# Patient Record
Sex: Male | Born: 1945 | Race: White | Hispanic: No | Marital: Married | Smoking: Never smoker
Health system: Southern US, Community
[De-identification: ages and names within clinical notes are randomized; demographics above are authoritative.]

## PROBLEM LIST (undated history)

## (undated) DIAGNOSIS — F32A Depression, unspecified: Secondary | ICD-10-CM

## (undated) DIAGNOSIS — I219 Acute myocardial infarction, unspecified: Secondary | ICD-10-CM

## (undated) DIAGNOSIS — Z87442 Personal history of urinary calculi: Secondary | ICD-10-CM

## (undated) DIAGNOSIS — M1990 Unspecified osteoarthritis, unspecified site: Secondary | ICD-10-CM

## (undated) DIAGNOSIS — F988 Other specified behavioral and emotional disorders with onset usually occurring in childhood and adolescence: Secondary | ICD-10-CM

## (undated) DIAGNOSIS — F419 Anxiety disorder, unspecified: Secondary | ICD-10-CM

## (undated) DIAGNOSIS — G4730 Sleep apnea, unspecified: Secondary | ICD-10-CM

## (undated) DIAGNOSIS — R51 Headache: Secondary | ICD-10-CM

## (undated) DIAGNOSIS — F329 Major depressive disorder, single episode, unspecified: Secondary | ICD-10-CM

## (undated) HISTORY — PX: HAND SURGERY: SHX662

## (undated) HISTORY — PX: KIDNEY STONE SURGERY: SHX686

---

## 2008-01-10 ENCOUNTER — Ambulatory Visit: Payer: Self-pay | Admitting: Cardiology

## 2008-01-11 ENCOUNTER — Ambulatory Visit: Payer: Self-pay | Admitting: Cardiology

## 2008-01-18 ENCOUNTER — Ambulatory Visit: Payer: Self-pay | Admitting: Cardiology

## 2012-11-26 ENCOUNTER — Ambulatory Visit: Payer: Medicare Other | Attending: Orthopaedic Surgery | Admitting: Physical Therapy

## 2012-11-26 DIAGNOSIS — IMO0001 Reserved for inherently not codable concepts without codable children: Secondary | ICD-10-CM | POA: Insufficient documentation

## 2012-11-26 DIAGNOSIS — M25569 Pain in unspecified knee: Secondary | ICD-10-CM | POA: Insufficient documentation

## 2012-11-26 DIAGNOSIS — R5381 Other malaise: Secondary | ICD-10-CM | POA: Insufficient documentation

## 2012-11-27 ENCOUNTER — Ambulatory Visit: Payer: Medicare Other | Admitting: Physical Therapy

## 2012-12-03 ENCOUNTER — Ambulatory Visit: Payer: Medicare Other | Attending: Orthopaedic Surgery | Admitting: Physical Therapy

## 2012-12-03 DIAGNOSIS — IMO0001 Reserved for inherently not codable concepts without codable children: Secondary | ICD-10-CM | POA: Insufficient documentation

## 2012-12-03 DIAGNOSIS — M79609 Pain in unspecified limb: Secondary | ICD-10-CM | POA: Insufficient documentation

## 2012-12-03 DIAGNOSIS — M25569 Pain in unspecified knee: Secondary | ICD-10-CM | POA: Insufficient documentation

## 2012-12-03 DIAGNOSIS — R5381 Other malaise: Secondary | ICD-10-CM | POA: Insufficient documentation

## 2012-12-05 ENCOUNTER — Ambulatory Visit: Payer: Medicare Other | Admitting: Physical Therapy

## 2013-01-24 ENCOUNTER — Other Ambulatory Visit (HOSPITAL_COMMUNITY): Payer: Self-pay | Admitting: Orthopaedic Surgery

## 2013-01-25 ENCOUNTER — Encounter (HOSPITAL_COMMUNITY): Payer: Self-pay | Admitting: Pharmacy Technician

## 2013-01-30 ENCOUNTER — Encounter (HOSPITAL_COMMUNITY)
Admission: RE | Admit: 2013-01-30 | Discharge: 2013-01-30 | Disposition: A | Discharge status: Home / Self Care | Payer: Medicare Other | Source: Ambulatory Visit | Attending: Orthopaedic Surgery | Admitting: Orthopaedic Surgery

## 2013-01-30 ENCOUNTER — Ambulatory Visit (HOSPITAL_COMMUNITY)
Admission: RE | Admit: 2013-01-30 | Discharge: 2013-01-30 | Disposition: A | Discharge status: Home / Self Care | Payer: Medicare Other | Source: Ambulatory Visit | Attending: Orthopaedic Surgery | Admitting: Orthopaedic Surgery

## 2013-01-30 ENCOUNTER — Encounter (HOSPITAL_COMMUNITY): Payer: Self-pay

## 2013-01-30 DIAGNOSIS — M48061 Spinal stenosis, lumbar region without neurogenic claudication: Secondary | ICD-10-CM | POA: Insufficient documentation

## 2013-01-30 DIAGNOSIS — R51 Headache: Secondary | ICD-10-CM | POA: Insufficient documentation

## 2013-01-30 DIAGNOSIS — Z01810 Encounter for preprocedural cardiovascular examination: Secondary | ICD-10-CM | POA: Insufficient documentation

## 2013-01-30 DIAGNOSIS — Z01818 Encounter for other preprocedural examination: Secondary | ICD-10-CM | POA: Insufficient documentation

## 2013-01-30 HISTORY — DX: Unspecified osteoarthritis, unspecified site: M19.90

## 2013-01-30 HISTORY — DX: Headache: R51

## 2013-01-30 HISTORY — DX: Major depressive disorder, single episode, unspecified: F32.9

## 2013-01-30 HISTORY — DX: Depression, unspecified: F32.A

## 2013-01-30 HISTORY — DX: Sleep apnea, unspecified: G47.30

## 2013-01-30 HISTORY — DX: Personal history of urinary calculi: Z87.442

## 2013-01-30 HISTORY — DX: Anxiety disorder, unspecified: F41.9

## 2013-01-30 HISTORY — DX: Other specified behavioral and emotional disorders with onset usually occurring in childhood and adolescence: F98.8

## 2013-01-30 HISTORY — DX: Acute myocardial infarction, unspecified: I21.9

## 2013-01-30 LAB — CBC
HEMATOCRIT: 46.4 % (ref 39.0–52.0)
HEMOGLOBIN: 17.1 g/dL — AB (ref 13.0–17.0)
MCH: 33.5 pg (ref 26.0–34.0)
MCHC: 36.9 g/dL — AB (ref 30.0–36.0)
MCV: 91 fL (ref 78.0–100.0)
Platelets: 214 10*3/uL (ref 150–400)
RBC: 5.1 MIL/uL (ref 4.22–5.81)
RDW: 12.5 % (ref 11.5–15.5)
WBC: 8.2 10*3/uL (ref 4.0–10.5)

## 2013-01-30 LAB — COMPREHENSIVE METABOLIC PANEL
ALBUMIN: 3.6 g/dL (ref 3.5–5.2)
ALK PHOS: 79 U/L (ref 39–117)
ALT: 45 U/L (ref 0–53)
AST: 33 U/L (ref 0–37)
BILIRUBIN TOTAL: 0.3 mg/dL (ref 0.3–1.2)
BUN: 25 mg/dL — ABNORMAL HIGH (ref 6–23)
CHLORIDE: 98 meq/L (ref 96–112)
CO2: 27 mEq/L (ref 19–32)
Calcium: 9.3 mg/dL (ref 8.4–10.5)
Creatinine, Ser: 0.91 mg/dL (ref 0.50–1.35)
GFR calc Af Amer: >90 mL/min (ref >90)
GFR calc non Af Amer: 86 mL/min — ABNORMAL LOW (ref >90)
Glucose, Bld: 252 mg/dL — ABNORMAL HIGH (ref 70–99)
Potassium: 4.7 mEq/L (ref 3.7–5.3)
SODIUM: 139 meq/L (ref 137–147)
TOTAL PROTEIN: 7.7 g/dL (ref 6.0–8.3)

## 2013-01-30 LAB — URINALYSIS, ROUTINE W REFLEX MICROSCOPIC
BILIRUBIN URINE: NEGATIVE
GLUCOSE, UA: NEGATIVE mg/dL
HGB URINE DIPSTICK: NEGATIVE
Ketones, ur: 15 mg/dL — AB
Nitrite: NEGATIVE
PH: 6.5 (ref 5.0–8.0)
Protein, ur: 30 mg/dL — AB
SPECIFIC GRAVITY, URINE: 1.028 (ref 1.005–1.030)
UROBILINOGEN UA: 1 mg/dL (ref 0.0–1.0)

## 2013-01-30 LAB — SURGICAL PCR SCREEN
MRSA, PCR: NEGATIVE
Staphylococcus aureus: NEGATIVE

## 2013-01-30 LAB — URINE MICROSCOPIC-ADD ON

## 2013-01-30 LAB — PROTIME-INR
INR: 0.9 (ref 0.00–1.49)
Prothrombin Time: 12 seconds (ref 11.6–15.2)

## 2013-01-30 NOTE — Progress Notes (Signed)
Glucose 252, patient denies having diabetes.  I faxed a request to Dr Arma HeadingJames Isernia in [place] for labs results and last office note.

## 2013-01-30 NOTE — H&P (Signed)
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  He has had symptoms since October.  He states that he has not been able to walk and exercise.  He has gradually gained weight.  He has been through physical therapy at [facility] in [place] for over a month.  He has been through acupuncture.  He has taken antiinflammatories, pain medication, heat and ice.  He denies any associated bowel or bladder symptoms.  His pain is worse when he stands.  He states that he cannot stand long enough to do most activities and is limited to only about 5 minutes standing.  At times he has had some increased pain that lasts for many hours and then he gets relief.  It is better if he sits. When he ambulates he can walk about 1-2 blocks and then has recurrent bilateral lower extremity weakness.  Then he has to stop, sit and rest and then can repeat.  He is normally followed by Dr. Burna Mortimer in [place], [place].  He lives in [place].     MEDICATIONS:  Include Xanax 1 mg at night, occasional Ambien, Venlafaxine 300 mg once a day for depression.   PAST MEDICAL HISTORY:  Positive for kidney stones in 2009.     FAMILY HISTORY:  Positive for diabetes.  Negative for heart disease.   SOCIAL HISTORY:  The patient is retired.  He does not smoke or drink.  He is married to his wife Jay Clements.     REVIEW OF SYSTEMS:  Fourteen point review of systems is positive for depression anxiety, sleep apnea, history of kidney stones in 2009.   PHYSICAL EXAMINATION:  The patient is alert and oriented.  He is 6 feet tall and weighs 260 pounds.  Extraocular movements intact.  No supraclavicular lymphadenopathy.  No rash over exposed skin.  Patient has no  accessory muscle inspiratory effort.  Heart has regular rate and rhythm.  Negative straight leg raise.  EHL and anterior tib is strong.  He has some mild sciatic notch tenderness bilaterally.  The skin over his back is normal.  Distal pulses are 2+.    RADIOGRAPHS/TESTS:  His MRI scan is reviewed.  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  There is a minimal bulge at L2-3 and L3-4.  He has severe lateral recess and severe central stenosis at the L4-5 level with narrowing down to 5 mm.  The rest of his canal is at 10.  He has some mild changes at other levels but basically a single level severe lumbar stenosis without spondylolisthesis.     PLAN:  He has been on muscle relaxants, antiinflammatories, TENS unit, acupuncture, physical therapy, walking activities and has had progressive lumbar stenosis symptoms.  I discussed with him that epidural steroids are not likely to help with lumbar stenosis.  The plan would be single level decompression.  He has intact pars and does not require a fusion.  Overnight stay.  Likely possibly a second night since [place] state [place] in [place].  The procedure was discussed include the risk of bleeding, infection, dural tear, reoperation, recurrence of stenosis over a number of years, possible progression at other levels, the risk  of DVT.  All questions were answered.  He states that his quality of life is going downhill and he is not able to do his normal activities.  He would like to proceed with operative intervention.       For additional information please see handwritten notes, reports, orders and prescriptions in this chart.      Mark C. Ophelia CharterYates, M.D.    Auto-Authenticated by Veverly FellsMark C. Ophelia CharterYates, M.D.

## 2013-01-30 NOTE — Pre-Procedure Instructions (Signed)
Jay Clements  01/30/2013   Your procedure is scheduled on:  Monday, January, February 2.  Report to [facility], Main Entrance / Entrance "A" at 12:50PM.  Call this number if you have problems the morning of surgery: [phone]   Remember:   Do not eat food or drink liquids after midnight Sunday.   Take these medicines the morning of surgery with A SIP OF WATER: venlafaxine XR (EFFEXOR-XR).     Do not wear jewelry, make-up or nail polish.  Do not wear lotions, powders, or perfumes. You may wear deodorant.   Men may shave face and neck.  Do not bring valuables to the hospital.  [facility] is not responsible for any belongings or valuables.               Contacts, dentures or bridgework may not be worn into surgery.  Leave suitcase in the car. After surgery it may be brought to your room.  For patients admitted to the hospital, discharge time is determined by your treatment team.               Patients discharged the day of surgery will not be allowed to drive home.  Name and phone number of your driver: -   Special Instructions: Shower using CHG 2 nights before surgery and the night before surgery.  If you shower the day of surgery use CHG.  Use special wash - you have one bottle of CHG for all showers.  You should use approximately 1/3 of the bottle for each shower.   Please read over the following fact sheets that you were given: Pain Booklet, Coughing and Deep Breathing and Surgical Site Infection Prevention

## 2013-01-31 NOTE — Progress Notes (Addendum)
Anesthesia Chart Review:  Patient is a 68 year old male scheduled for L4-5 decompression on 02/04/13 by Dr. Ophelia CharterYates.  History includes obesity, OSA, MI 2003, anxiety, ADD, headaches, arthritis, nephrolithiasis.  PCP is Dr. Arma HeadingJames Isernia in [place], [place].  EKG on 01/30/13 showed SR with sinus arrhythmia, occasional PVC.  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 [place], [phone]) around 2003.  It sounds like he may have had a stress test that showed "scar."  He said that he was never referred to cardiology for further evaluation.  He denies cardiac cath.  He denies history of chest pain or SOB at rest.  He has not been able to be very active for about a year due to back/leg pain.  He has to walk slowly. He is able to do his grocery shopping.  He has gained 30-40 lbs, but denies edema.  He has DOE, but no lightheadedness, palpitations, syncope.   CXR on 01/30/13 showed no active cardiopulmonary disease, old left upper rib fractures.  Preoperative labs noted. Glucose is 252.  He has no reported history of DM.  He did complete a prednisone taper approximately one week ago.  His glucose on4/16/14 was 63. He ate a bagel with cream cheese just prior to his PAT visit.  Dr. Ophelia CharterYates is aware of his elevated glucose and notified Dr. Prentice DockerIsernia's office.  They are seeing him for fasting glucose tomorrow morning.    I attempted to contact [facility], but they said they did not even open their doors until 2009.  I have called Dr. Prentice DockerIsernia's office three times this afternoon and either nurse line was busy or had no answer.  There is no record of a stress or echo at [facility] in [place].  I'll re-attempt to contact Dr. Prentice DockerIsernia's office tomorrow in hopes to clarify his cardiac history.    Velna Ochsllison Hodges Treiber, PA-C [facility]Anesthesiology Phone [phone] 01/31/2013 5:27 PM  Addendum: 02/01/2013 11:10 AM I called and spoke with nurse Archie Pattenonya  today at Dr. Prentice DockerIsernia's office.  She reviewed records.  There is no evidence stress or echo, or mention of MI in his chart.  He had several EKGs that showed sinus arrhythmia and/or occasional PVCs.  Patient is for fasting and post-prandial glucose readings today at their office, as well as a HgbA1C.  They will determine if any additional recommendations needed preoperatively based on results. At this time, I'm unable to confirm if in fact patient had a prior MI.  If so, his PCP is unaware.  He was never referred to cardiology or for a cardiac cath, so it doesn't appear that findings in 2003 where felt very worrisome.  He denies chest pain and overall EKG is unremarkable with a single PVC.  I reviewed with anesthesiologist Dr. Krista BlueSinger.  If glucose on arrival is reasonable and no new CV symptoms then it is anticipated that he can proceed as planned.

## 2013-02-01 ENCOUNTER — Encounter (HOSPITAL_COMMUNITY): Payer: Self-pay

## 2013-02-03 MED ORDER — CEFAZOLIN SODIUM-DEXTROSE 2-3 GM-% IV SOLR
2.0000 g | INTRAVENOUS | Status: AC
  Administered 2013-02-04: 2 g via INTRAVENOUS
  Filled 2013-02-03: qty 50

## 2013-02-04 ENCOUNTER — Inpatient Hospital Stay (HOSPITAL_COMMUNITY): Payer: Medicare Other | Admitting: Anesthesiology

## 2013-02-04 ENCOUNTER — Inpatient Hospital Stay (HOSPITAL_COMMUNITY): Payer: Medicare Other

## 2013-02-04 ENCOUNTER — Encounter (HOSPITAL_COMMUNITY)
Admission: RE | Disposition: A | Discharge status: Home / Self Care | Payer: Self-pay | Source: Ambulatory Visit | Attending: Orthopaedic Surgery

## 2013-02-04 ENCOUNTER — Encounter (HOSPITAL_COMMUNITY): Payer: Medicare Other | Admitting: Vascular Surgery

## 2013-02-04 ENCOUNTER — Encounter (HOSPITAL_COMMUNITY): Payer: Self-pay | Admitting: *Deleted

## 2013-02-04 ENCOUNTER — Observation Stay (HOSPITAL_COMMUNITY)
Admission: RE | Admit: 2013-02-04 | Discharge: 2013-02-05 | Disposition: A | Discharge status: Home / Self Care | Payer: Medicare Other | Source: Ambulatory Visit | Attending: Orthopaedic Surgery | Admitting: Orthopaedic Surgery

## 2013-02-04 DIAGNOSIS — F988 Other specified behavioral and emotional disorders with onset usually occurring in childhood and adolescence: Secondary | ICD-10-CM | POA: Insufficient documentation

## 2013-02-04 DIAGNOSIS — R51 Headache: Secondary | ICD-10-CM | POA: Insufficient documentation

## 2013-02-04 DIAGNOSIS — F329 Major depressive disorder, single episode, unspecified: Secondary | ICD-10-CM | POA: Insufficient documentation

## 2013-02-04 DIAGNOSIS — Z87442 Personal history of urinary calculi: Secondary | ICD-10-CM | POA: Insufficient documentation

## 2013-02-04 DIAGNOSIS — M48062 Spinal stenosis, lumbar region with neurogenic claudication: Principal | ICD-10-CM | POA: Diagnosis present

## 2013-02-04 DIAGNOSIS — F411 Generalized anxiety disorder: Secondary | ICD-10-CM | POA: Insufficient documentation

## 2013-02-04 DIAGNOSIS — M48061 Spinal stenosis, lumbar region without neurogenic claudication: Secondary | ICD-10-CM | POA: Diagnosis present

## 2013-02-04 DIAGNOSIS — M129 Arthropathy, unspecified: Secondary | ICD-10-CM | POA: Insufficient documentation

## 2013-02-04 DIAGNOSIS — F3289 Other specified depressive episodes: Secondary | ICD-10-CM | POA: Insufficient documentation

## 2013-02-04 DIAGNOSIS — I252 Old myocardial infarction: Secondary | ICD-10-CM | POA: Insufficient documentation

## 2013-02-04 DIAGNOSIS — G4730 Sleep apnea, unspecified: Secondary | ICD-10-CM | POA: Insufficient documentation

## 2013-02-04 DIAGNOSIS — R7309 Other abnormal glucose: Secondary | ICD-10-CM | POA: Insufficient documentation

## 2013-02-04 HISTORY — PX: LUMBAR LAMINECTOMY: SHX95

## 2013-02-04 LAB — GLUCOSE, CAPILLARY: GLUCOSE-CAPILLARY: 170 mg/dL — AB (ref 70–99)

## 2013-02-04 SURGERY — MICRODISCECTOMY LUMBAR LAMINECTOMY
Anesthesia: General | Site: Spine Lumbar

## 2013-02-04 MED ORDER — KETOROLAC TROMETHAMINE 30 MG/ML IJ SOLN
30.0000 mg | Freq: Once | INTRAMUSCULAR | Status: AC
  Administered 2013-02-04: 30 mg via INTRAVENOUS

## 2013-02-04 MED ORDER — MIDAZOLAM HCL 5 MG/5ML IJ SOLN
INTRAMUSCULAR | Status: DC | PRN
  Administered 2013-02-04: 2 mg via INTRAVENOUS

## 2013-02-04 MED ORDER — VENLAFAXINE HCL ER 150 MG PO CP24
150.0000 mg | ORAL_CAPSULE | Freq: Every day | ORAL | Status: DC
  Administered 2013-02-05: 150 mg via ORAL
  Filled 2013-02-04 (×2): qty 1

## 2013-02-04 MED ORDER — ARTIFICIAL TEARS OP OINT
TOPICAL_OINTMENT | OPHTHALMIC | Status: AC
  Filled 2013-02-04: qty 3.5

## 2013-02-04 MED ORDER — BUPIVACAINE HCL (PF) 0.25 % IJ SOLN
INTRAMUSCULAR | Status: AC
  Filled 2013-02-04: qty 30

## 2013-02-04 MED ORDER — MENTHOL 3 MG MT LOZG: 1.0000 | LOZENGE | OROMUCOSAL | Status: DC | PRN

## 2013-02-04 MED ORDER — ACETAMINOPHEN 650 MG RE SUPP: 650.0000 mg | RECTAL | Status: DC | PRN

## 2013-02-04 MED ORDER — SODIUM CHLORIDE 0.9 % IV SOLN
INTRAVENOUS | Status: DC
  Administered 2013-02-04 (×2): via INTRAVENOUS

## 2013-02-04 MED ORDER — METHOCARBAMOL 100 MG/ML IJ SOLN: 500.0000 mg | Freq: Four times a day (QID) | INTRAVENOUS | Status: DC | PRN

## 2013-02-04 MED ORDER — ONDANSETRON HCL 4 MG/2ML IJ SOLN
INTRAMUSCULAR | Status: DC | PRN
  Administered 2013-02-04: 4 mg via INTRAVENOUS

## 2013-02-04 MED ORDER — MIDAZOLAM HCL 2 MG/2ML IJ SOLN
INTRAMUSCULAR | Status: AC
  Filled 2013-02-04: qty 2

## 2013-02-04 MED ORDER — PROPOFOL 10 MG/ML IV BOLUS
INTRAVENOUS | Status: DC | PRN
  Administered 2013-02-04: 200 mg via INTRAVENOUS

## 2013-02-04 MED ORDER — VECURONIUM BROMIDE 10 MG IV SOLR
INTRAVENOUS | Status: AC
  Filled 2013-02-04: qty 10

## 2013-02-04 MED ORDER — SODIUM CHLORIDE 0.9 % IV SOLN: 250.0000 mL | INTRAVENOUS | Status: DC

## 2013-02-04 MED ORDER — 0.9 % SODIUM CHLORIDE (POUR BTL) OPTIME
TOPICAL | Status: DC | PRN
  Administered 2013-02-04: 1000 mL

## 2013-02-04 MED ORDER — LACTATED RINGERS IV SOLN
Freq: Once | INTRAVENOUS | Status: AC
  Administered 2013-02-04: 12:00:00 via INTRAVENOUS

## 2013-02-04 MED ORDER — POTASSIUM CHLORIDE IN NACL 20-0.45 MEQ/L-% IV SOLN
INTRAVENOUS | Status: DC
  Filled 2013-02-04 (×3): qty 1000

## 2013-02-04 MED ORDER — KETOROLAC TROMETHAMINE 30 MG/ML IJ SOLN
INTRAMUSCULAR | Status: AC
  Filled 2013-02-04: qty 1

## 2013-02-04 MED ORDER — PHENOL 1.4 % MT LIQD: 1.0000 | OROMUCOSAL | Status: DC | PRN

## 2013-02-04 MED ORDER — HYDROMORPHONE HCL PF 1 MG/ML IJ SOLN
0.2500 mg | INTRAMUSCULAR | Status: DC | PRN
  Administered 2013-02-04 (×4): 0.5 mg via INTRAVENOUS

## 2013-02-04 MED ORDER — HYDROMORPHONE HCL PF 1 MG/ML IJ SOLN
INTRAMUSCULAR | Status: AC
  Administered 2013-02-04: 0.5 mg via INTRAVENOUS
  Filled 2013-02-04: qty 1

## 2013-02-04 MED ORDER — MIDAZOLAM HCL 2 MG/2ML IJ SOLN: 0.5000 mg | Freq: Once | INTRAMUSCULAR | Status: DC | PRN

## 2013-02-04 MED ORDER — CEFAZOLIN SODIUM 1-5 GM-% IV SOLN
1.0000 g | Freq: Three times a day (TID) | INTRAVENOUS | Status: AC
  Administered 2013-02-04 – 2013-02-05 (×2): 1 g via INTRAVENOUS
  Filled 2013-02-04 (×2): qty 50

## 2013-02-04 MED ORDER — THROMBIN 20000 UNITS EX SOLR
CUTANEOUS | Status: AC
  Filled 2013-02-04: qty 20000

## 2013-02-04 MED ORDER — FENTANYL CITRATE 0.05 MG/ML IJ SOLN
INTRAMUSCULAR | Status: AC
  Filled 2013-02-04: qty 5

## 2013-02-04 MED ORDER — VECURONIUM BROMIDE 10 MG IV SOLR
INTRAVENOUS | Status: DC | PRN
  Administered 2013-02-04: 2 mg via INTRAVENOUS

## 2013-02-04 MED ORDER — ROCURONIUM BROMIDE 50 MG/5ML IV SOLN
INTRAVENOUS | Status: AC
  Filled 2013-02-04: qty 1

## 2013-02-04 MED ORDER — SODIUM CHLORIDE 0.9 % IJ SOLN
3.0000 mL | Freq: Two times a day (BID) | INTRAMUSCULAR | Status: DC
  Administered 2013-02-04: 3 mL via INTRAVENOUS

## 2013-02-04 MED ORDER — STERILE WATER FOR INJECTION IJ SOLN
INTRAMUSCULAR | Status: AC
  Filled 2013-02-04: qty 10

## 2013-02-04 MED ORDER — OXYCODONE-ACETAMINOPHEN 5-325 MG PO TABS
1.0000 | ORAL_TABLET | ORAL | Status: DC | PRN
Start: 2013-02-04 — End: 2013-02-05
  Administered 2013-02-05 (×2): 2 via ORAL
  Filled 2013-02-04 (×2): qty 2

## 2013-02-04 MED ORDER — SODIUM CHLORIDE 0.9 % IJ SOLN
3.0000 mL | INTRAMUSCULAR | Status: DC | PRN
Start: 2013-02-04 — End: 2013-02-05

## 2013-02-04 MED ORDER — ONDANSETRON HCL 4 MG/2ML IJ SOLN
INTRAMUSCULAR | Status: AC
  Filled 2013-02-04: qty 2

## 2013-02-04 MED ORDER — OXYCODONE HCL 5 MG PO TABS: 5.0000 mg | ORAL_TABLET | Freq: Once | ORAL | Status: DC | PRN

## 2013-02-04 MED ORDER — ONDANSETRON HCL 4 MG/2ML IJ SOLN: 4.0000 mg | INTRAMUSCULAR | Status: DC | PRN

## 2013-02-04 MED ORDER — LIDOCAINE HCL (CARDIAC) 20 MG/ML IV SOLN
INTRAVENOUS | Status: AC
  Filled 2013-02-04: qty 5

## 2013-02-04 MED ORDER — ACETAMINOPHEN 325 MG PO TABS: 650.0000 mg | ORAL_TABLET | ORAL | Status: DC | PRN

## 2013-02-04 MED ORDER — ALPRAZOLAM 0.5 MG PO TABS
1.0000 mg | ORAL_TABLET | Freq: Every day | ORAL | Status: DC
  Administered 2013-02-04: 2 mg via ORAL
  Filled 2013-02-04: qty 4

## 2013-02-04 MED ORDER — DOCUSATE SODIUM 100 MG PO CAPS
100.0000 mg | ORAL_CAPSULE | Freq: Two times a day (BID) | ORAL | Status: DC
  Administered 2013-02-04: 100 mg via ORAL
  Filled 2013-02-04 (×3): qty 1

## 2013-02-04 MED ORDER — LIDOCAINE HCL (CARDIAC) 20 MG/ML IV SOLN
INTRAVENOUS | Status: DC | PRN
  Administered 2013-02-04: 25 mg via INTRAVENOUS

## 2013-02-04 MED ORDER — OXYCODONE HCL 5 MG/5ML PO SOLN: 5.0000 mg | Freq: Once | ORAL | Status: DC | PRN

## 2013-02-04 MED ORDER — ROCURONIUM BROMIDE 100 MG/10ML IV SOLN
INTRAVENOUS | Status: DC | PRN
  Administered 2013-02-04: 50 mg via INTRAVENOUS

## 2013-02-04 MED ORDER — LACTATED RINGERS IV SOLN
INTRAVENOUS | Status: DC | PRN
  Administered 2013-02-04 (×2): via INTRAVENOUS

## 2013-02-04 MED ORDER — MEPERIDINE HCL 25 MG/ML IJ SOLN
6.2500 mg | INTRAMUSCULAR | Status: DC | PRN
Start: 2013-02-04 — End: 2013-02-04

## 2013-02-04 MED ORDER — METHOCARBAMOL 500 MG PO TABS
500.0000 mg | ORAL_TABLET | Freq: Four times a day (QID) | ORAL | Status: DC | PRN
  Administered 2013-02-05: 500 mg via ORAL
  Filled 2013-02-04: qty 1

## 2013-02-04 MED ORDER — PROPOFOL 10 MG/ML IV BOLUS
INTRAVENOUS | Status: AC
  Filled 2013-02-04: qty 20

## 2013-02-04 MED ORDER — METHYLPHENIDATE HCL 5 MG PO TABS
10.0000 mg | ORAL_TABLET | Freq: Three times a day (TID) | ORAL | Status: DC
  Administered 2013-02-05 (×2): 10 mg via ORAL
  Filled 2013-02-04 (×2): qty 2

## 2013-02-04 MED ORDER — FENTANYL CITRATE 0.05 MG/ML IJ SOLN
INTRAMUSCULAR | Status: DC | PRN
  Administered 2013-02-04: 250 ug via INTRAVENOUS
  Administered 2013-02-04: 50 ug via INTRAVENOUS

## 2013-02-04 MED ORDER — ARTIFICIAL TEARS OP OINT
TOPICAL_OINTMENT | OPHTHALMIC | Status: DC | PRN
  Administered 2013-02-04: 1 via OPHTHALMIC

## 2013-02-04 MED ORDER — PROMETHAZINE HCL 25 MG/ML IJ SOLN: 6.2500 mg | INTRAMUSCULAR | Status: DC | PRN

## 2013-02-04 MED ORDER — MORPHINE SULFATE 2 MG/ML IJ SOLN: 2.0000 mg | INTRAMUSCULAR | Status: DC | PRN

## 2013-02-04 MED ORDER — BUPIVACAINE HCL (PF) 0.25 % IJ SOLN
INTRAMUSCULAR | Status: DC | PRN
  Administered 2013-02-04: 10 mL

## 2013-02-04 MED ORDER — OXYCODONE-ACETAMINOPHEN 5-325 MG PO TABS: 2.0000 | ORAL_TABLET | ORAL | Status: AC | PRN

## 2013-02-04 SURGICAL SUPPLY — 49 items
BUR ROUND FLUTED 4 SOFT TCH (BURR) ×2 IMPLANT
BUR ROUND FLUTED 4MM SOFT TCH (BURR) ×1
CANISTER SUCTION 2500CC (MISCELLANEOUS) ×3 IMPLANT
CLOTH BEACON ORANGE TIMEOUT ST (SAFETY) ×3 IMPLANT
CORDS BIPOLAR (ELECTRODE) ×3 IMPLANT
COVER SURGICAL LIGHT HANDLE (MISCELLANEOUS) ×3 IMPLANT
DERMABOND ADVANCED (GAUZE/BANDAGES/DRESSINGS) ×2
DERMABOND ADVANCED .7 DNX12 (GAUZE/BANDAGES/DRESSINGS) ×1 IMPLANT
DRAPE MICROSCOPE LEICA (MISCELLANEOUS) ×3 IMPLANT
DRAPE PROXIMA HALF (DRAPES) ×6 IMPLANT
DRSG EMULSION OIL 3X3 NADH (GAUZE/BANDAGES/DRESSINGS) ×3 IMPLANT
DRSG MEPILEX BORDER 4X4 (GAUZE/BANDAGES/DRESSINGS) ×3 IMPLANT
DRSG MEPILEX BORDER 4X8 (GAUZE/BANDAGES/DRESSINGS) ×3 IMPLANT
DURAPREP 26ML APPLICATOR (WOUND CARE) ×3 IMPLANT
ELECT REM PT RETURN 9FT ADLT (ELECTROSURGICAL) ×3
ELECTRODE REM PT RTRN 9FT ADLT (ELECTROSURGICAL) ×1 IMPLANT
GLOVE BIOGEL PI IND STRL 7.5 (GLOVE) ×1 IMPLANT
GLOVE BIOGEL PI IND STRL 8 (GLOVE) ×1 IMPLANT
GLOVE BIOGEL PI INDICATOR 7.5 (GLOVE) ×2
GLOVE BIOGEL PI INDICATOR 8 (GLOVE) ×2
GLOVE ECLIPSE 7.0 STRL STRAW (GLOVE) ×3 IMPLANT
GLOVE ORTHO TXT STRL SZ7.5 (GLOVE) ×3 IMPLANT
GOWN PREVENTION PLUS LG XLONG (DISPOSABLE) IMPLANT
GOWN STRL NON-REIN LRG LVL3 (GOWN DISPOSABLE) ×9 IMPLANT
KIT BASIN OR (CUSTOM PROCEDURE TRAY) ×3 IMPLANT
KIT ROOM TURNOVER OR (KITS) ×3 IMPLANT
MANIFOLD NEPTUNE II (INSTRUMENTS) IMPLANT
NDL SUT .5 MAYO 1.404X.05X (NEEDLE) IMPLANT
NEEDLE 22X1 1/2 (OR ONLY) (NEEDLE) ×3 IMPLANT
NEEDLE MAYO TAPER (NEEDLE)
NEEDLE SPNL 18GX3.5 QUINCKE PK (NEEDLE) ×3 IMPLANT
NS IRRIG 1000ML POUR BTL (IV SOLUTION) ×3 IMPLANT
PACK LAMINECTOMY ORTHO (CUSTOM PROCEDURE TRAY) ×3 IMPLANT
PAD ARMBOARD 7.5X6 YLW CONV (MISCELLANEOUS) ×6 IMPLANT
PATTIES SURGICAL .5 X.5 (GAUZE/BANDAGES/DRESSINGS) ×3 IMPLANT
PATTIES SURGICAL .75X.75 (GAUZE/BANDAGES/DRESSINGS) IMPLANT
SPONGE GAUZE 4X4 12PLY (GAUZE/BANDAGES/DRESSINGS) ×3 IMPLANT
SUT BONE WAX W31G (SUTURE) ×3 IMPLANT
SUT VIC AB 2-0 CT1 27 (SUTURE) ×2
SUT VIC AB 2-0 CT1 TAPERPNT 27 (SUTURE) ×1 IMPLANT
SUT VICRYL 0 TIES 12 18 (SUTURE) ×3 IMPLANT
SUT VICRYL 4-0 PS2 18IN ABS (SUTURE) IMPLANT
SUT VICRYL AB 2 0 TIES (SUTURE) ×3 IMPLANT
SYR 20ML ECCENTRIC (SYRINGE) IMPLANT
SYR CONTROL 10ML LL (SYRINGE) ×6 IMPLANT
TAPE CLOTH SURG 4X10 WHT LF (GAUZE/BANDAGES/DRESSINGS) ×3 IMPLANT
TOWEL OR 17X24 6PK STRL BLUE (TOWEL DISPOSABLE) ×3 IMPLANT
TOWEL OR 17X26 10 PK STRL BLUE (TOWEL DISPOSABLE) ×3 IMPLANT
WATER STERILE IRR 1000ML POUR (IV SOLUTION) IMPLANT

## 2013-02-04 NOTE — Progress Notes (Signed)
Patient brought his home nasal pillows from home but left a part.  RT explained to patient that the hospital could supply a mask and he stated that he didn't want to try.  Patient stated that he thought he would be okay without the CPAP tonight.  RT advised the patient and spouse if they changed their mind that the RT could come back.  RT will continue to monitor.

## 2013-02-04 NOTE — Preoperative (Addendum)
Beta Blockers   Reason not to administer Beta Blockers:Not Applicable 

## 2013-02-04 NOTE — Transfer of Care (Signed)
Immediate Anesthesia Transfer of Care Note  Patient: Jay Clements  Procedure(s) Performed: Procedure(s) with comments: LUMBAR FOUR-FIVE MICRODISCECTOMY LUMBAR LAMINECTOMY (N/A) - L4-5 Decompression  Patient Location: PACU  Anesthesia Type:General  Level of Consciousness: awake  Airway & Oxygen Therapy: Patient Spontanous Breathing and Patient connected to face mask  Post-op Assessment: Report given to PACU RN, Post -op Vital signs reviewed and stable and Patient moving all extremities  Post vital signs: Reviewed and stable  Complications: No apparent anesthesia complications

## 2013-02-04 NOTE — Brief Op Note (Signed)
02/04/2013  5:27 PM  PATIENT:  Siri ColeHerbert Morganti  68 y.o. male  PRE-OPERATIVE DIAGNOSIS:  L4-5 Stenosis  POST-OPERATIVE DIAGNOSIS:  L4-5 Stenosis  PROCEDURE:  Procedure(s) with comments: LUMBAR FOUR-FIVE MICRODISCECTOMY LUMBAR LAMINECTOMY (N/A) - L4-5 Decompression  SURGEON:  Surgeon(s) and Role:    * Eldred MangesMark C Kelley Knoth, MD - Primary  PHYSICIAN ASSISTANT:   ASSISTANTS: April Fulp RNFA   ANESTHESIA:   general  EBL:  Total I/O In: 1800 [I.V.:1800] Out: -   BLOOD ADMINISTERED:none  DRAINS: none   LOCAL MEDICATIONS USED:  MARCAINE     SPECIMEN:  No Specimen  DISPOSITION OF SPECIMEN:  N/A  COUNTS:  YES  TOURNIQUET:  * No tourniquets in log *  DICTATION: .Dragon Dictation  PLAN OF CARE: Admit for overnight observation  PATIENT DISPOSITION:  PACU - hemodynamically stable.   Delay start of Pharmacological VTE agent (>24hrs) due to surgical blood loss or risk of bleeding: not applicable,  No chemical due to risk of epidural hematoma .

## 2013-02-04 NOTE — Interval H&P Note (Signed)
History and Physical Interval Note:  02/04/2013 2:40 PM  Jay Clements  has presented today for surgery, with the diagnosis of L4-5 Stenosis  The various methods of treatment have been discussed with the patient and family. After consideration of risks, benefits and other options for treatment, the patient has consented to  Procedure(s) with comments: MICRODISCECTOMY LUMBAR LAMINECTOMY (N/A) - L4-5 Decompression as a surgical intervention .  The patient's history has been reviewed, patient examined, no change in status, stable for surgery.  I have reviewed the patient's chart and labs.  Questions were answered to the patient's satisfaction.     YATES,MARK C

## 2013-02-04 NOTE — Anesthesia Preprocedure Evaluation (Addendum)
Anesthesia Evaluation  Patient identified by MRN, date of birth, ID band Patient awake    Reviewed: Allergy & Precautions, H&P , NPO status , Patient's Chart, lab work & pertinent test results  History of Anesthesia Complications Negative for: history of anesthetic complications  Airway Mallampati: III TM Distance: >3 FB Neck ROM: Full    Dental  (+) Teeth Intact, Dental Advisory Given and Caps   Pulmonary sleep apnea and Continuous Positive Airway Pressure Ventilation ,  breath sounds clear to auscultation  Pulmonary exam normal       Cardiovascular - anginanegative cardio ROS  Rhythm:Regular Rate:Normal     Neuro/Psych  Headaches, Anxiety Depression Back pain    GI/Hepatic negative GI ROS, Neg liver ROS,   Endo/Other  Diabetes: borderline.Morbid obesityGlu 170  Renal/GU negative Renal ROS     Musculoskeletal   Abdominal (+) + obese,   Peds  Hematology   Anesthesia Other Findings   Reproductive/Obstetrics                        Anesthesia Physical Anesthesia Plan  ASA: III  Anesthesia Plan: General   Post-op Pain Management:    Induction: Intravenous  Airway Management Planned: Oral ETT  Additional Equipment:   Intra-op Plan:   Post-operative Plan: Extubation in OR  Informed Consent: I have reviewed the patients History and Physical, chart, labs and discussed the procedure including the risks, benefits and alternatives for the proposed anesthesia with the patient or authorized representative who has indicated his/her understanding and acceptance.   Dental advisory given  Plan Discussed with: CRNA and Surgeon  Anesthesia Plan Comments: (Plan routine monitors, GETA)        Anesthesia Quick Evaluation

## 2013-02-04 NOTE — Anesthesia Procedure Notes (Addendum)
Procedure Name: Intubation Date/Time: 02/04/2013 3:06 PM Performed by: Lovie CholOCK, Dechelle Attaway K Pre-anesthesia Checklist: Patient identified, Emergency Drugs available, Suction available, Patient being monitored and Timeout performed Patient Re-evaluated:Patient Re-evaluated prior to inductionOxygen Delivery Method: Circle system utilized Preoxygenation: Pre-oxygenation with 100% oxygen Intubation Type: IV induction Ventilation: Two handed mask ventilation required, Oral airway inserted - appropriate to patient size and Mask ventilation without difficulty Laryngoscope Size: Miller and 2 Grade View: Grade III Tube type: Oral Tube size: 7.5 mm Number of attempts: 1 Airway Equipment and Method: Bougie stylet Placement Confirmation: ETT inserted through vocal cords under direct vision,  positive ETCO2,  CO2 detector and breath sounds checked- equal and bilateral Secured at: 23 cm Tube secured with: Tape Dental Injury: Teeth and Oropharynx as per pre-operative assessment  Difficulty Due To: Difficult Airway- due to anterior larynx

## 2013-02-04 NOTE — Discharge Instructions (Addendum)
Walk daily , no lifting, avoid bending,  No driving until off all pain medication.    See Dr. Ophelia CharterYates in 2 wks. Call office for appt  If you do not already have one scheduled. OK to shower and apply dressing or large band-aid so pants do not rub on the incision.

## 2013-02-04 NOTE — Anesthesia Postprocedure Evaluation (Signed)
Anesthesia Post Note  Patient: Jay Clements  Procedure(s) Performed: Procedure(s) (LRB): LUMBAR FOUR-FIVE MICRODISCECTOMY LUMBAR LAMINECTOMY (N/A)  Anesthesia type: General  Patient location: PACU  Post pain: Pain level controlled  Post assessment: Patient's Cardiovascular Status Stable  Last Vitals:  Filed Vitals:   02/04/13 1916  BP: 123/73  Pulse: 85  Temp:   Resp: 19    Post vital signs: Reviewed and stable  Level of consciousness: alert  Complications: No apparent anesthesia complications

## 2013-02-04 NOTE — Op Note (Signed)
Test test test  Preop diagnosis: L4-5 lumbar spinal stenosis with neurogenic claudication  Postop diagnosis: Same  Procedure: L4-5 central decompression and bilateral lateral recess decompression  Surgeon: Annell GreeningMark Delania Ferg M.D.  Asst.: April Fulp R.N. FA  Anesthesia: Gen. +10 cc Marcaine skin local  EBL 100 cc  Complications: None  Procedure after induction general anesthesia operculum intubation Ancef prophylaxis prone on chest rolls careful padding and positioning pads over the ulnar nerve rolled yellow pads underneath the shoulders the back was prepped with DuraPrep the area squared with towels Betadine Steri-Drape applied and laminectomy sheet and drape.  Needle localization crosstable laterals x-ray confirmed the appropriate level. Incision was made so process section out to the lamina and then x-ray was taken with the 2 Coker clamps on the plan level decompression. Inferior caudal clamp was a little bit too caudal it was suggested repeat imaging was taken which showed it was at the appropriate position. Miochol retractor was used and the deepest widest blade. Posterior elements were removed lamina was thin which was hypertrophic. Patient had narrowing down to 5 mm centrally and this was short segment stenosis at the level of the disc with hypertrophic ligamentum overhanging spurs from the facet. Spurs removed Giemsa ligament were removed dura was protected with paddies thrombin with Gelfoam was placed in the gutters. Continued dissection removing chunks of the ligamentum until the dura was decompressed. Penfield 4 and hockey-stick are placed above and below the decompression confirmed the lateral x-ray that there is appropriate decompression corresponding with the area of severe stenosis on the MRI scan. Lateral gutters were make sure was no remaining chunks of ligament or any overhanging pain or sharp bone spike the could damage the dura. This was inspected on both sides no microdiscectomy was  performed there is and is diffuse disc bulge. No anterolisthesis and no abnormal motion with the Tessio spinous processes before posterior decompression was started after initial opening and subperiosteal dissection of the lamina. Area was irrigated fascia was closed with the 0 Vicryl 2-0 Vicryl subtendinous tissue for Vicryl subarticular closure. Dermabond postop dressing after Marcaine infiltration and transferred to her room in stable condition. Signed Annell GreeningMark Khairi Garman M.D.

## 2013-02-05 ENCOUNTER — Encounter (HOSPITAL_COMMUNITY): Payer: Self-pay | Admitting: Orthopaedic Surgery

## 2013-02-05 LAB — BASIC METABOLIC PANEL
BUN: 35 mg/dL — AB (ref 6–23)
CHLORIDE: 98 meq/L (ref 96–112)
CO2: 28 mEq/L (ref 19–32)
Calcium: 8.4 mg/dL (ref 8.4–10.5)
Creatinine, Ser: 0.98 mg/dL (ref 0.50–1.35)
GFR calc Af Amer: >90 mL/min (ref >90)
GFR, EST NON AFRICAN AMERICAN: 84 mL/min — AB (ref >90)
GLUCOSE: 228 mg/dL — AB (ref 70–99)
Potassium: 4.4 mEq/L (ref 3.7–5.3)
Sodium: 138 mEq/L (ref 137–147)

## 2013-02-05 LAB — CBC
HEMATOCRIT: 39.6 % (ref 39.0–52.0)
Hemoglobin: 13.9 g/dL (ref 13.0–17.0)
MCH: 32.3 pg (ref 26.0–34.0)
MCHC: 35.1 g/dL (ref 30.0–36.0)
MCV: 92.1 fL (ref 78.0–100.0)
Platelets: 185 10*3/uL (ref 150–400)
RBC: 4.3 MIL/uL (ref 4.22–5.81)
RDW: 12.7 % (ref 11.5–15.5)
WBC: 7.8 10*3/uL (ref 4.0–10.5)

## 2013-02-05 MED ORDER — THROMBIN 20000 UNITS EX SOLR
CUTANEOUS | Status: DC | PRN
  Administered 2013-02-05: 16:00:00 via TOPICAL

## 2013-02-05 NOTE — Care Management Note (Signed)
CARE MANAGEMENT NOTE 02/05/2013  Patient:  Jay Clements,Jay Clements   Account Number:  [id number]  Date Initiated:  02/05/2013  Documentation initiated by:  Vance PeperBRADY,Effie Janoski  Subjective/Objective Assessment:   [age]yr old male s/p L4-5 microdiskectomy     Action/Plan:   No home health needs identified.   Anticipated DC Date:  02/05/2013   Anticipated DC Plan:  HOME/SELF CARE      DC Planning Services  CM consult      Choice offered to / List presented to:             Status of service:  Completed, signed off Medicare Important Message given?   (If response is "NO", the following Medicare IM given date fields will be blank) Date Medicare IM given:   Date Additional Medicare IM given:    Discharge Disposition:  HOME/SELF CARE  Per UR Regulation:    If discussed at Long Length of Stay Meetings, dates discussed:    Comments:

## 2013-02-05 NOTE — Evaluation (Signed)
Physical Therapy Evaluation Patient Details Name: Jay Clements MRN: [id number] DOB: 04/03/1945 Today's Date: 02/05/2013 Time: 4098-1191 PT Time Calculation (min): 52 min  PT Assessment / Plan / Recommendation History of Present Illness  Pt is a 68 y/o male admitted s/p L4-5 microdiscectomy and lumbar laminectomy.  Clinical Impression  This patient presents with acute pain and decreased functional independence following the above mentioned procedure. At the time of PT eval, pt was able to perform transfers and ambulation with min guard or supervision. Provided handout for back precautions and discussed in detail with pt and family. This patient is appropriate for skilled PT interventions to address functional limitations, improve safety and independence with functional mobility, and return to PLOF.     PT Assessment  Patent does not need any further PT services    Follow Up Recommendations  No PT follow up    Does the patient have the potential to tolerate intense rehabilitation      Barriers to Discharge        Equipment Recommendations  Rolling walker with 5" wheels;3in1 (PT)    Recommendations for Other Services     Frequency      Precautions / Restrictions Precautions Precautions: Fall;Back Precaution Booklet Issued: Yes (comment) Precaution Comments: Discussed back precautions with pt and family.  Required Braces or Orthoses:  (No brace per MD) Restrictions Weight Bearing Restrictions: No   Pertinent Vitals/Pain Pt reports minimal pain throughout session until end. RN notified and pain medication received prior to end of therapy.       Mobility  Bed Mobility Overal bed mobility: Needs Assistance Bed Mobility: Rolling;Sidelying to Sit Rolling: Modified independent (Device/Increase time) Sidelying to sit: Supervision General bed mobility comments: VC's to maintain back precautions during logroll. Transfers Overall transfer level: Needs assistance Equipment used:  Rolling walker (2 wheeled) Transfers: Sit to/from Stand Sit to Stand: Supervision General transfer comment: VC's to maintain back precautions and for hand placement on seated surface for safety.  Ambulation/Gait Ambulation/Gait assistance: Supervision Ambulation Distance (Feet): 150 Feet Assistive device: Rolling walker (2 wheeled) Gait Pattern/deviations: Step-through pattern;Decreased stride length Gait velocity: Decreased Gait velocity interpretation: Below normal speed for age/gender General Gait Details: VC's for sequencing with the RW and techniques to maintain back precautions while ambulating and looking around the hallway.  Stairs: Yes Stairs assistance: Min guard Stair Management: Forwards;One rail Right Number of Stairs: 2 General stair comments: VC's for sequencing and safety awareness.     Exercises     PT Diagnosis:    PT Problem List:   PT Treatment Interventions:       PT Goals(Current goals can be found in the care plan section) Acute Rehab PT Goals Patient Stated Goal: To get back to walking without a walker PT Goal Formulation: With patient/family Time For Goal Achievement: 02/19/13 Potential to Achieve Goals: Good  Visit Information  Last PT Received On: 02/05/13 History of Present Illness: Pt is a 68 y/o male admitted s/p L4-5 microdiscectomy and lumbar laminectomy.       Prior Functioning  Home Living Family/patient expects to be discharged to:: Private residence Living Arrangements: Spouse/significant other Available Help at Discharge: Family Type of Home: House Home Access: Stairs to enter [employer] of Steps: 1 Entrance Stairs-Rails: None Home Layout: Two level;Able to live on main level with bedroom/bathroom Home Equipment: Walker - 2 wheels;Grab bars - tub/shower Prior Function Level of Independence: Independent Communication Communication: No difficulties Dominant Hand: Right    Cognition  Cognition Arousal/Alertness:  Awake/alert  Behavior During Therapy: WFL for tasks assessed/performed Overall Cognitive Status: Within Functional Limits for tasks assessed    Extremity/Trunk Assessment Upper Extremity Assessment Upper Extremity Assessment: Defer to OT evaluation Lower Extremity Assessment Lower Extremity Assessment: Overall WFL for tasks assessed Cervical / Trunk Assessment Cervical / Trunk Assessment: Normal   Balance Balance Overall balance assessment: No apparent balance deficits (not formally assessed)  End of Session PT - End of Session Equipment Utilized During Treatment: Gait belt Activity Tolerance: Patient tolerated treatment well Patient left: in chair;with call bell/phone within reach;with family/visitor present Nurse Communication: Mobility status  GP Functional Assessment Tool Used: Clinical judgement Functional Limitation: Mobility: Walking and moving around Mobility: Walking and Moving Around Current Status [phone](G8978): At least 1 percent but less than 20 percent impaired, limited or restricted Mobility: Walking and Moving Around Goal Status [phone](G8979): At least 1 percent but less than 20 percent impaired, limited or restricted Mobility: Walking and Moving Around Discharge Status [phone](G8980): At least 1 percent but less than 20 percent impaired, limited or restricted   Jay CancerHamilton, Jay Clements 02/05/2013, 2:13 PM  Jay CancerLaura Hamilton, PT, DPT [phone]

## 2013-02-05 NOTE — Evaluation (Addendum)
Occupational Therapy Evaluation and Discharge Patient Details Name: Jay Clements MRN: [id number] DOB: 1945-01-29 Today's Date: 02/05/2013 Time: 4098-1191 OT Time Calculation (min): 32 min  OT Assessment / Plan / Recommendation History of present illness Pt is a 68 y/o male admitted s/p L4-5 microdiscectomy and lumbar laminectomy.   Clinical Impression   This 68 yo male admitted and underwent above presents to acute OT with all education completed, will D/C from acute OT.    OT Assessment  Patient does not need any further OT services    Follow Up Recommendations  No OT follow up       Equipment Recommendations  None recommended by OT          Precautions / Restrictions Precautions Precautions: Fall;Back Precaution Booklet Issued: Yes (comment) Precaution Comments: Discussed back precautions with pt and family.  Required Braces or Orthoses:  (No brace per MD) Restrictions Weight Bearing Restrictions: No       ADL  Equipment Used: Rolling walker Transfers/Ambulation Related to ADLs: Min guard A for all with RW ADL Comments: Pt cannot cross his legs to get to his feet for LB ADLs, so I demonstrated and had him return demonstrate with AE. Wife went down to gift shop and got reacher and sock aid (I changed out standard sock aid for wide sock aid). Pt/wife not interested in getting shower seat, says pt will sponge bath until he feel she can safely get in/out of shower and take shower. I did demonstrate in the room how he is to get into and out of the shower once he feel he is ready to try.       Acute Rehab OT Goals Patient Stated Goal: Home today  Visit Information  Last OT Received On: 02/05/13 Assistance Needed: +1 History of Present Illness: Pt is a 68 y/o male admitted s/p L4-5 microdiscectomy and lumbar laminectomy.       Prior Functioning     Home Living Family/patient expects to be discharged to:: Private residence Living Arrangements: Spouse/significant  other Available Help at Discharge: Family Type of Home: House Home Access: Stairs to enter [employer] of Steps: 1 Entrance Stairs-Rails: None Home Layout: Two level;Able to live on main level with bedroom/bathroom Home Equipment: Walker - 2 wheels;Grab bars - tub/shower Prior Function Level of Independence: Independent Communication Communication: No difficulties Dominant Hand: Right         Vision/Perception Vision - History Patient Visual Report: No change from baseline   Cognition  Cognition Arousal/Alertness: Awake/alert Behavior During Therapy: WFL for tasks assessed/performed Overall Cognitive Status: Within Functional Limits for tasks assessed    Extremity/Trunk Assessment Upper Extremity Assessment Upper Extremity Assessment: Overall WFL for tasks assessed     Mobility Bed Mobility General bed mobility comments: Pt up in recliner upon arrival Transfers Overall transfer level: Needs assistance Equipment used: Rolling walker (2 wheeled) Transfers: Sit to/from Stand Sit to Stand: Min guard General transfer comment: A couple of "bobbles' with me when up on his feet           End of Session OT - End of Session Equipment Utilized During Treatment: Rolling walker Activity Tolerance: Patient tolerated treatment well Patient left: in chair;with call bell/phone within reach;with family/visitor present   Functional Assessment Tool Used: Clinical observation Functional Limitation: Self care Self Care Current Status (Y7829): At least 1 percent but less than 20 percent impaired, limited or restricted Self Care Goal Status (F6213): At least 1 percent but less than 20 percent impaired, limited  or restricted Self Care Discharge Status [phone]([id number]): At least 1 percent but less than 20 percent impaired, limited or restricted   Evette GeorgesLeonard, Jay Clements Jay Clements [phone] 02/05/2013, 3:19 PM

## 2013-02-08 NOTE — Discharge Summary (Signed)
Physician Discharge Summary  Patient ID: Jay Clements Drollinger MRN: [id number] DOB/AGE: 67/05/1945 67 y.o.  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 Sleep apnea   . Anxiety   . ADD (attention deficit disorder)   . History of kidney stones   . Depression   . Headache(784.0)   . Arthritis   . Myocardial infarction     unclear details possibe "silent" MI in 2003 found as part of DPT physicial    Surgeries: Procedure(s): LUMBAR FOUR-FIVE MICRODISCECTOMY LUMBAR LAMINECTOMY L4-5 on 02/04/2013   Consultants (if any):  none  Discharged Condition: Improved  Hospital Course: Jay Clements Sisk is an 68 y.o. male who was admitted 02/04/2013 with a diagnosis of Spinal stenosis, lumbar region, with neurogenic claudication and went to the operating room on 02/04/2013 and underwent the above named procedures.    He was given perioperative antibiotics:      Anti-infectives   Start     Dose/Rate Route Frequency Ordered Stop   02/04/13 2100  ceFAZolin (ANCEF) IVPB 1 g/50 mL premix     1 g 100 mL/hr over 30 Minutes Intravenous Every 8 hours 02/04/13 2036 02/05/13 0628   02/04/13 0600  ceFAZolin (ANCEF) IVPB 2 g/50 mL premix     2 g 100 mL/hr over 30 Minutes Intravenous On call to O.R. 02/03/13 1319 02/04/13 1505    .  He was given sequential compression devices, early ambulation for DVT prophylaxis.  He benefited maximally from the hospital stay and there were no complications.    Recent vital signs:  Filed Vitals:   02/05/13 0627  BP: 145/70  Pulse: 84  Temp: 97.6 F (36.4 C)  Resp: 18    Recent laboratory studies:  Lab Results  Component Value Date   HGB 13.9 02/05/2013   HGB 17.1* 01/30/2013   Lab  Results  Component Value Date   WBC 7.8 02/05/2013   PLT 185 02/05/2013   Lab Results  Component Value Date   INR 0.90 01/30/2013   Lab Results  Component Value Date   NA 138 02/05/2013   K 4.4 02/05/2013   CL 98 02/05/2013   CO2 28 02/05/2013   BUN 35* 02/05/2013   CREATININE 0.98 02/05/2013   GLUCOSE 228* 02/05/2013    Discharge Medications:     Medication List    STOP taking these medications       zolpidem 10 MG tablet  Commonly known as:  AMBIEN      TAKE these medications       ALPRAZolam 1 MG tablet  Commonly known as:  XANAX  Take 1-2 mg by mouth at bedtime.     FLINTSTONES PLUS IRON PO  Take 2 tablets by mouth daily.     methylphenidate 10 MG tablet  Commonly known as:  RITALIN  Take 10 mg by mouth 3 (three) times daily with meals.     oxyCODONE-acetaminophen 5-325 MG per tablet  Commonly known as:  ROXICET  Take 2 tablets by mouth every 4 (four) hours as needed for severe pain.     venlafaxine XR 150 MG 24 hr capsule  Commonly known as:  EFFEXOR-XR  Take 150 mg by mouth daily with breakfast.        Diagnostic Studies: Dg Chest 2  View  01/30/2013   CLINICAL DATA:  Headache, preop for lumbar stenosis  EXAM: CHEST  2 VIEW  COMPARISON:  None.  FINDINGS: Cardiomediastinal silhouette is unremarkable. No acute infiltrate or pulmonary edema. Old left upper rib fractures. Mild degenerative changes thoracic spine.  IMPRESSION: No active cardiopulmonary disease.  Old left upper rib fractures.   Electronically Signed   By: Natasha Mead M.D.   On: 01/30/2013 16:03   Dg Lumbar Spine 2-3 Views  02/04/2013   CLINICAL DATA:  68 year old male undergoing lumbar surgery. Initial encounter.  EXAM: LUMBAR SPINE - 2-3 VIEW  COMPARISON:  None.  FINDINGS: Three intraoperative portable cross-table lateral views of the lumbar spine. Normal lumbar segmentation presumed, and appears to be valid with lowest ribs designated T12 by this numbering system.  Film #1 at 1536 hrs. Needle directed at the  superior L5 endplate level.  Film #2 at 1545 hrs. Surgical clamp on the superior L4 spinous process. Surgical clamp on the inferior L5 spinous process.  Film #3 at 1547 hrs. Surgical clamps remain projecting over the L4 and L5 spinous processes.  IMPRESSION: Intraoperative localization as above.   Electronically Signed   By: Augusto Gamble M.D.   On: 02/04/2013 16:08   Dg Lumbar Spine 1 View  02/04/2013   CLINICAL DATA:  Localization for lumbar laminectomy.  EXAM: LUMBAR SPINE - 1 VIEW  COMPARISON:  DG LUMBAR SPINE 2-3 VIEWS dated 02/04/2013  FINDINGS: Metallic instruments bracket the L4-L5 disc level. The instrument whose external portion lies above the large metallic clamp has its distal tip projecting posterior to the L5 vertebral body. The instrument whose external portion lies below the large clamp has its tip projecting posterior to the L4 vertebral body.  IMPRESSION: The metallic marker probes bracket the L4-5 disc level as described.   Electronically Signed   By: David  [place]   On: 02/04/2013 17:29    Disposition: 01-Home or Self Care Discharge instructions:  Walk daily , no lifting, avoid bending,  No driving until off all pain medication.    See Dr. Ophelia Charter in 2 wks. Call office for appt  If you do not already have one scheduled. OK to shower and apply dressing or large band-aid so pants do not rub on the incision.    Follow-up Information   Follow up with Eldred Manges, MD. Schedule an appointment as soon as possible for a visit in 2 weeks.   Specialty:  Orthopedic Surgery   Contact information:   [address] [place] [address] [phone]        Signed: Wende Neighbors 02/08/2013, 10:41 AM

## 2015-03-04 DEATH — deceased

## 2015-11-21 IMAGING — CR DG CHEST 2V
2 series · 2 of 2 positions shown · non-contrast
Comparison: None.

CLINICAL DATA: Headache, preop for lumbar stenosis

EXAM:
CHEST  2 VIEW

[w chest pa]
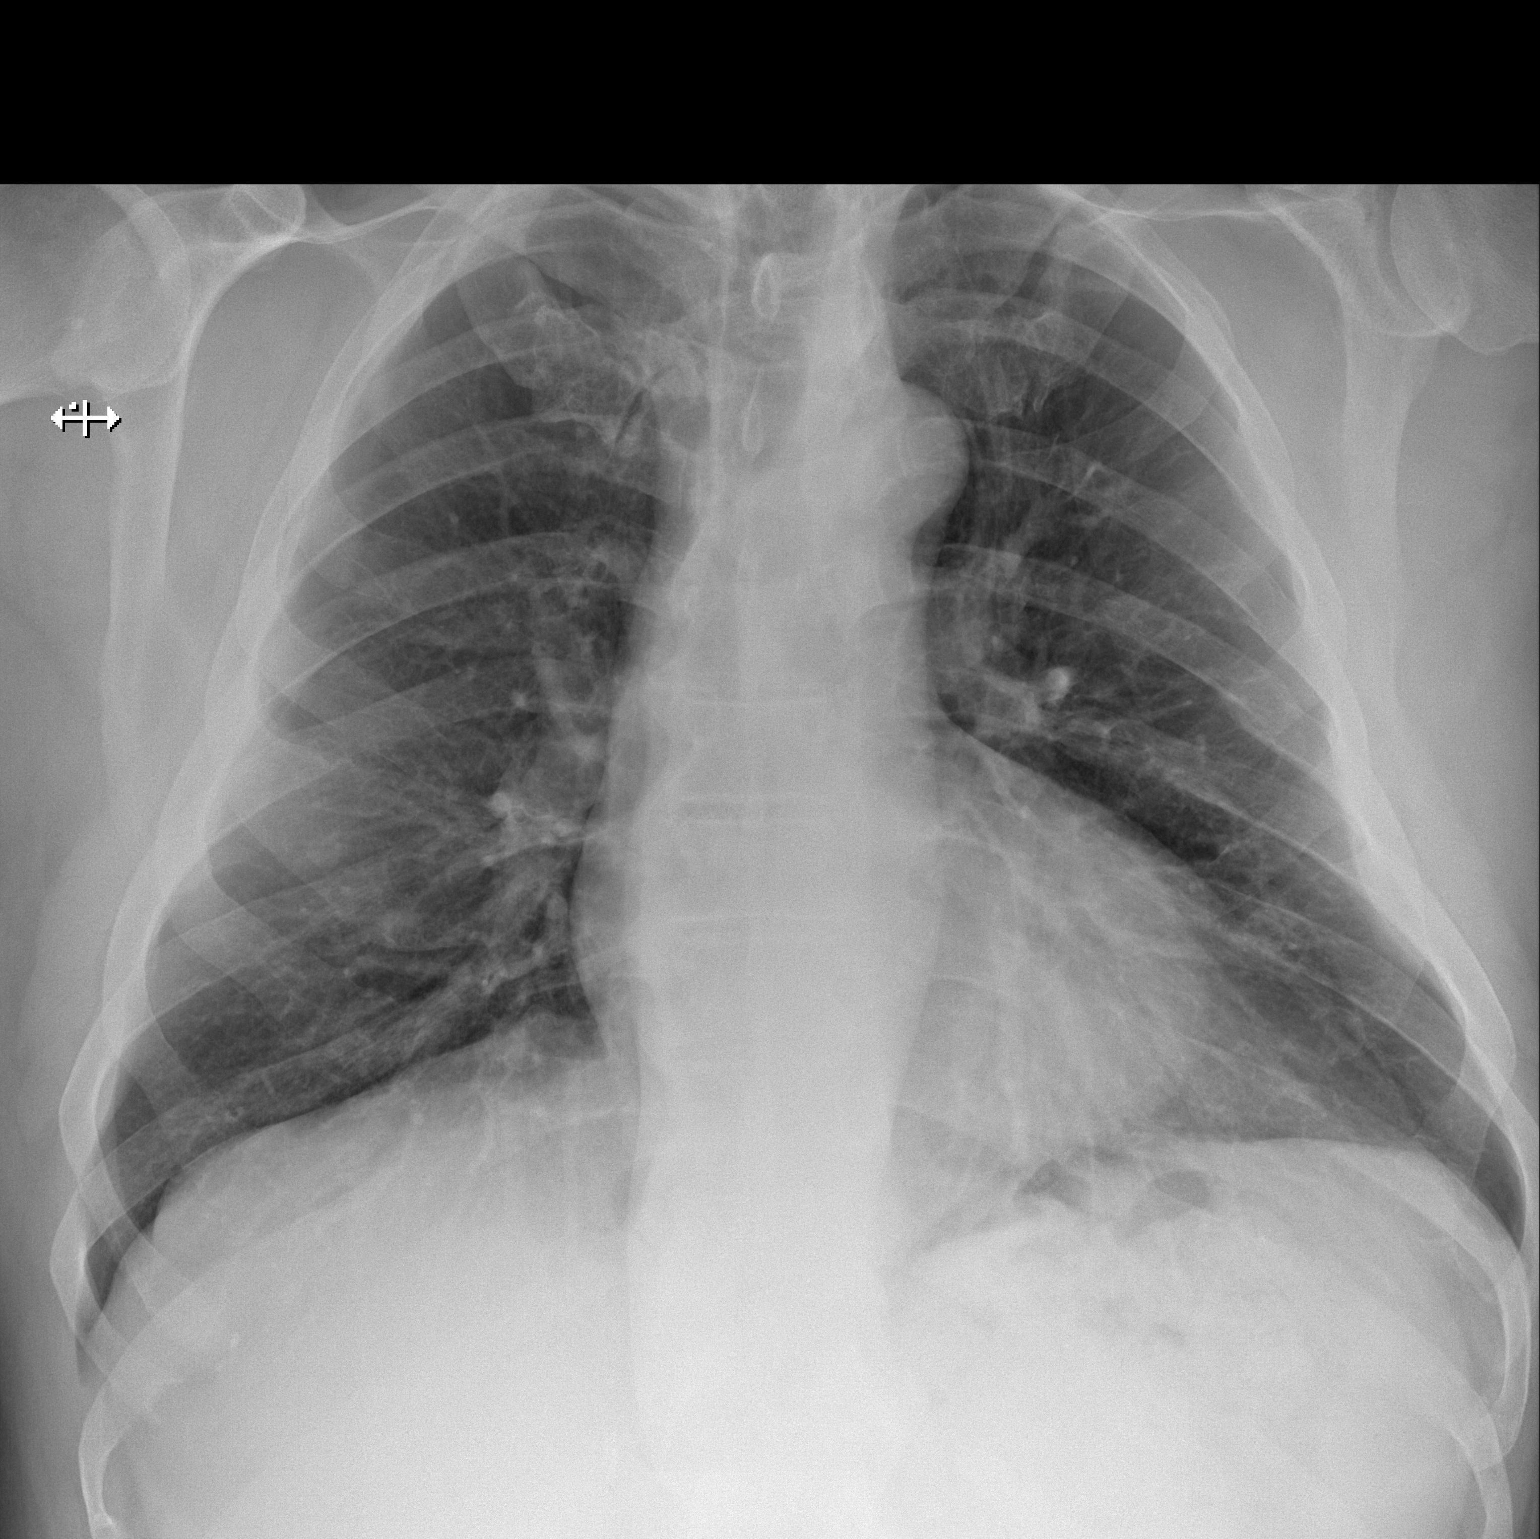

[w chest lat]
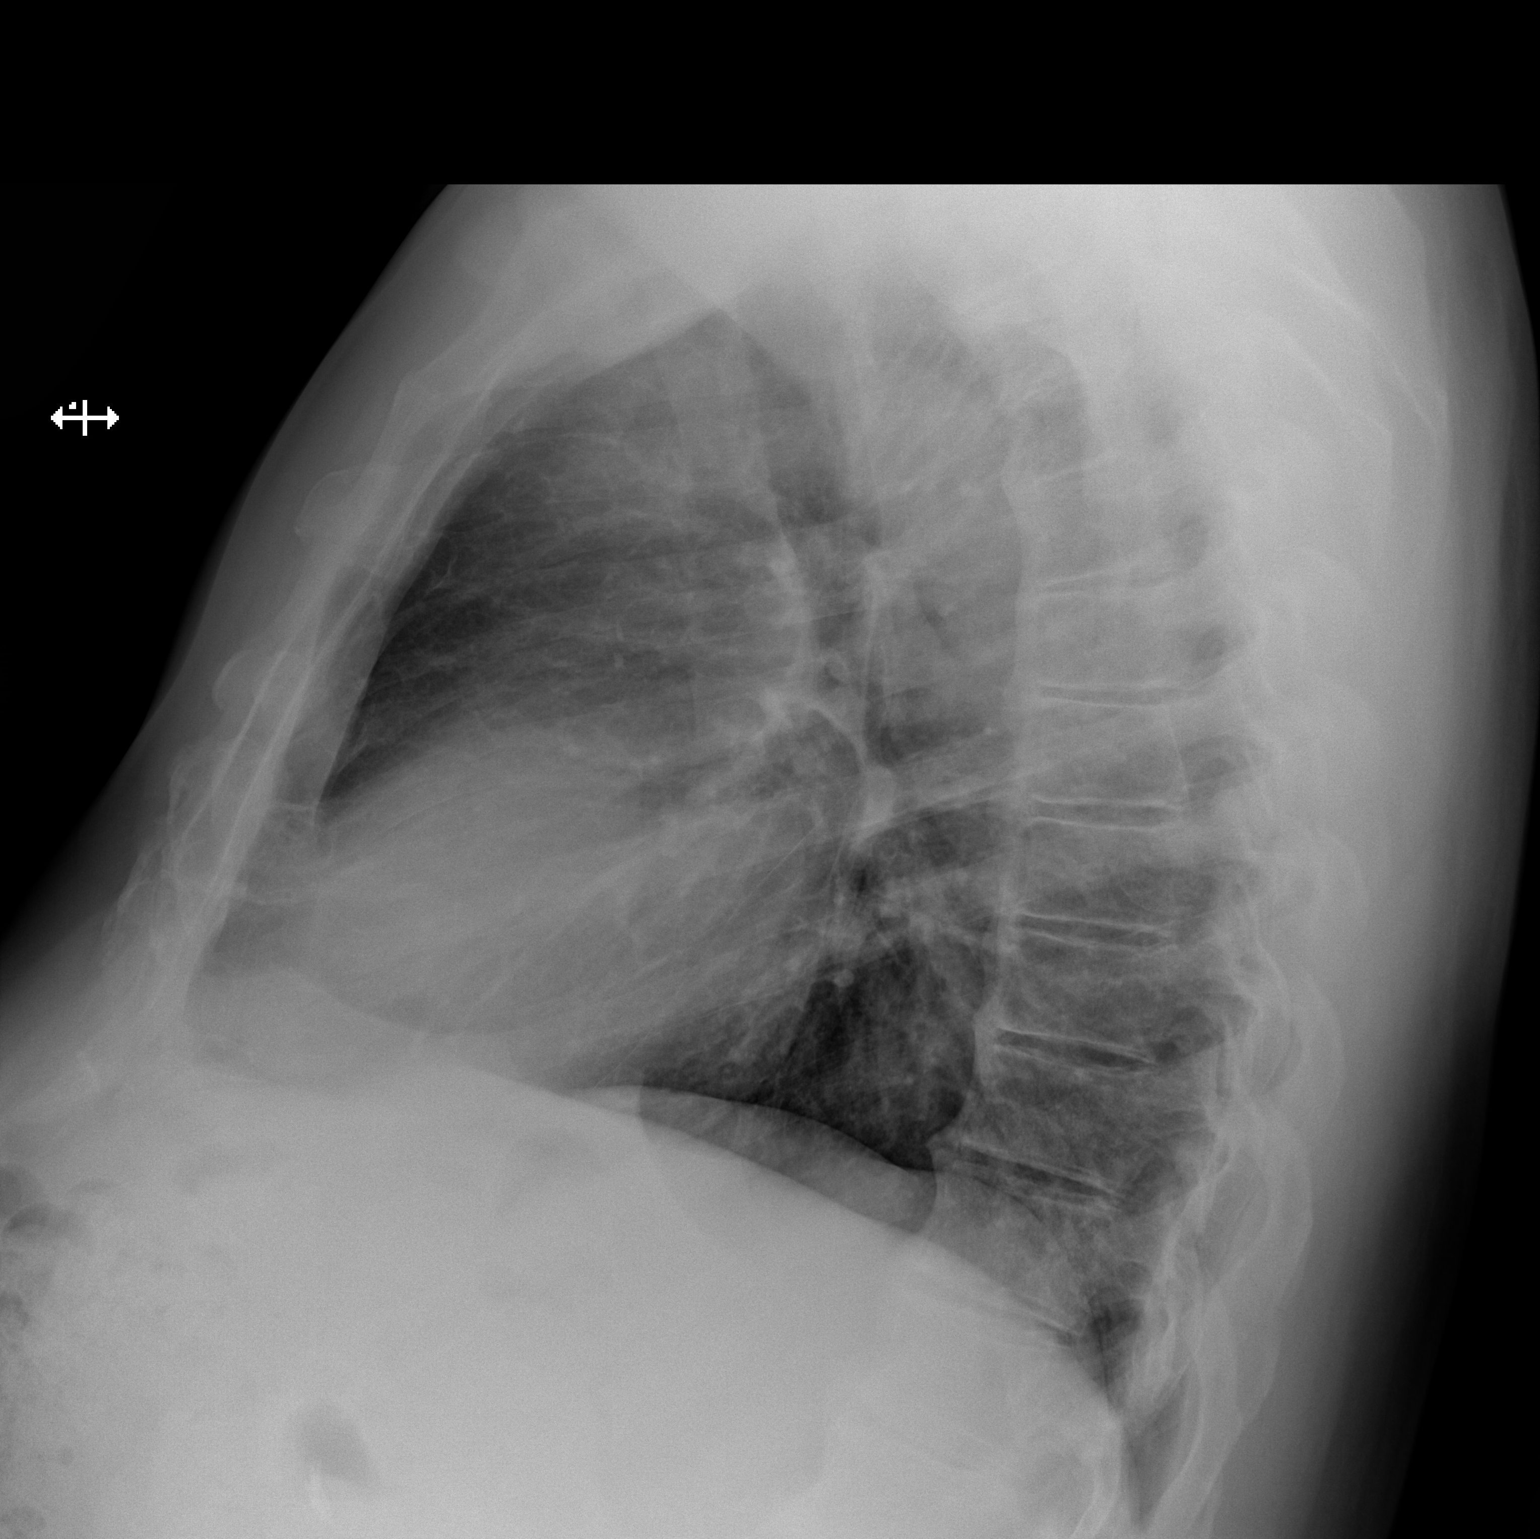

[2 of 2 positions shown; findings below may reference images not displayed]

FINDINGS: Cardiomediastinal silhouette is unremarkable. No acute infiltrate or
pulmonary edema. Old left upper rib fractures. Mild degenerative
changes thoracic spine.
IMPRESSION: No active cardiopulmonary disease.  Old left upper rib fractures.

## 2015-11-26 IMAGING — DX DG LUMBAR SPINE 1V
1 series · 1 of 1 positions shown · non-contrast
Comparison: DG LUMBAR SPINE 2-3 VIEWS dated 02/04/2013

CLINICAL DATA: Localization for lumbar laminectomy.

EXAM:
LUMBAR SPINE - 1 VIEW

[lat]
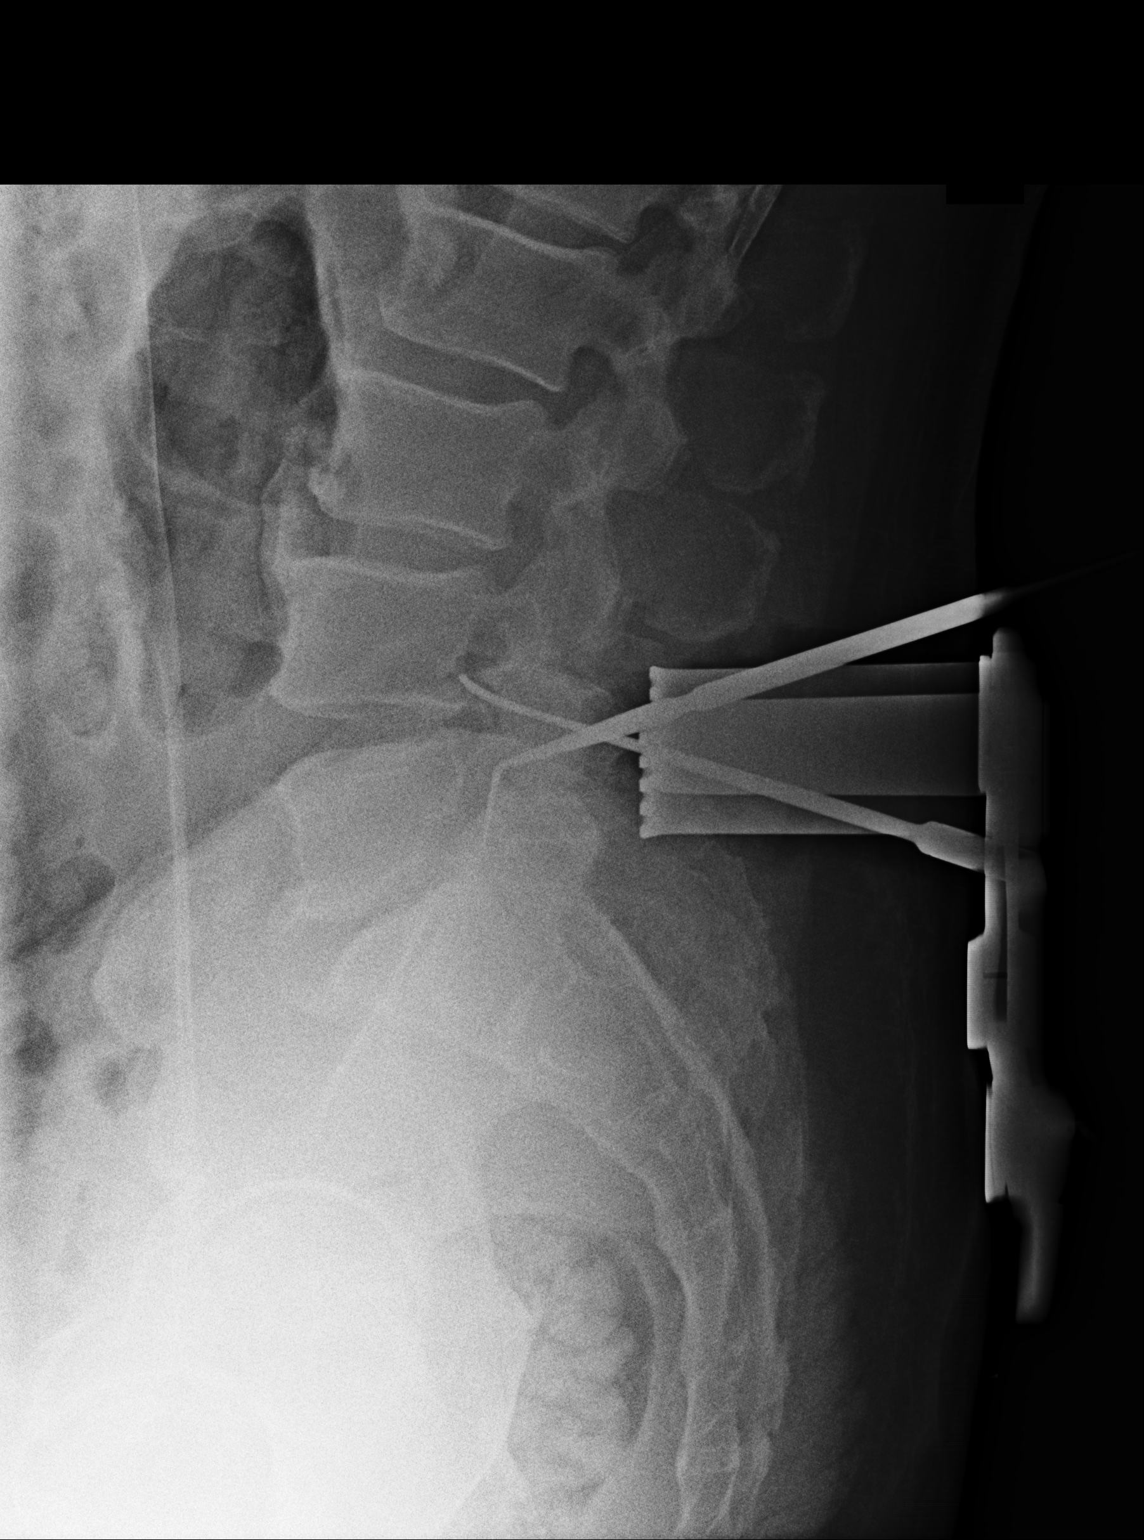

[1 of 1 positions shown; findings below may reference images not displayed]

FINDINGS: Metallic instruments bracket the L4-L5 disc level. The instrument
whose external portion lies above the large metallic clamp has its
distal tip projecting posterior to the L5 vertebral body. The
instrument whose external portion lies below the large clamp has its
tip projecting posterior to the L4 vertebral body.
IMPRESSION: The metallic marker probes bracket the L4-5 disc level as described.

## 2015-11-26 IMAGING — CR DG LUMBAR SPINE 2-3V
1 series · 1 of 1 positions shown · non-contrast
Comparison: None.

CLINICAL DATA: 67-year-old male undergoing lumbar surgery. Initial
encounter.

EXAM:
LUMBAR SPINE - 2-3 VIEW

[lateral]
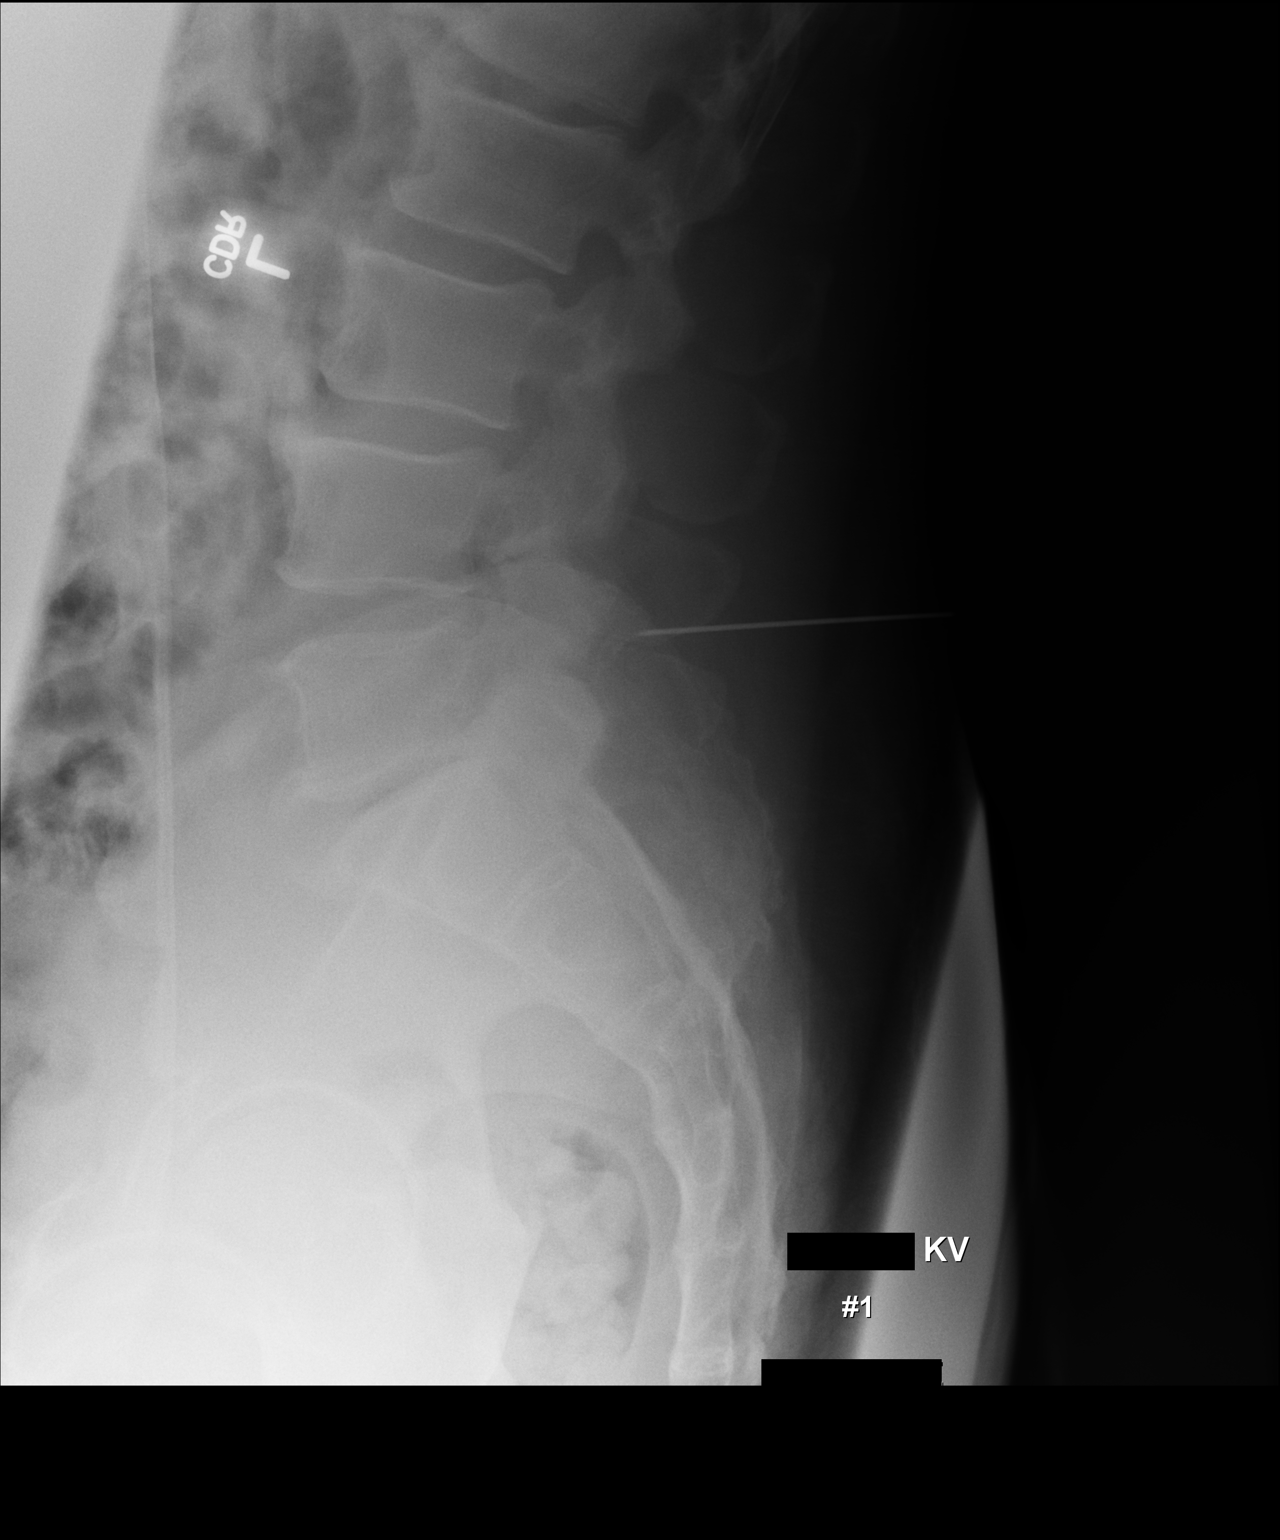

[1 of 1 positions shown; findings below may reference images not displayed]

FINDINGS: Three intraoperative portable cross-table lateral views of the
lumbar spine. Normal lumbar segmentation presumed, and appears to be
valid with lowest ribs designated T12 by this numbering system.

Film #1 at 1759 hrs. Needle directed at the superior L5 endplate
level.

Film #2 at 4555 hrs. Surgical clamp on the superior L4 spinous
process. Surgical clamp on the inferior L5 spinous process.

Film #3 at 7749 hrs. Surgical clamps remain projecting over the L4
and L5 spinous processes.
IMPRESSION: Intraoperative localization as above.
